# Patient Record
Sex: Female | Born: 1995 | Race: White | Hispanic: No | Marital: Single | Smoking: Never smoker
Health system: Southern US, Community
[De-identification: ages and names within clinical notes are randomized; demographics above are authoritative.]

## PROBLEM LIST (undated history)

## (undated) DIAGNOSIS — F419 Anxiety disorder, unspecified: Secondary | ICD-10-CM

## (undated) DIAGNOSIS — F32A Depression, unspecified: Secondary | ICD-10-CM

## (undated) HISTORY — DX: Anxiety disorder, unspecified: F41.9

## (undated) HISTORY — PX: WISDOM TOOTH EXTRACTION: SHX21

## (undated) HISTORY — PX: BREAST SURGERY: SHX581

## (undated) HISTORY — DX: Depression, unspecified: F32.A

---
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
## 2019-04-01 ENCOUNTER — Ambulatory Visit: Payer: Self-pay | Attending: Internal Medicine

## 2021-05-31 NOTE — Progress Notes (Signed)
? ?New Patient Office Visit ? ?Subjective:  ?Patient ID: Felicia Stevens, female    DOB: 22-Oct-1995  Age: 26 y.o. MRN: [id number] ? ?CC:  ?Chief Complaint  ?Patient presents with  ? Establish Care  ?  Np. Est care. Med refill   ? ? ?HPI ?Felicia Stevens presents for new patient visit to establish care.  Introduced to Publishing rights manager role and practice setting.  All questions answered.  Discussed provider/patient relationship and expectations. ? ?She has a history of depression and anxiety. She tried wellbutrin which didn't help and caused her to feel more depressed. Then she was started on lexapro which helped for a while. Then she lost her health insurance, so she stopped the medication. She then re-started the medication and felt it was helping as much as in the past and was having more anxiety. Then she was switched to zoloft 100mg  daily. This has significantly helped her anxiety and it helps with her depression. She states that she still has trouble with motivation and feels overwhelmed with chores that need to be done around the house. She also endorses some mood swings, irritability, and fidgeting. She has not been to psychiatry however she has talked to a therapist in the past.  ? ?Depression screen [facility] 2/9 06/01/2021  ?Decreased Interest 1  ?Down, Depressed, Hopeless 0  ?PHQ - 2 Score 1  ?Altered sleeping 0  ?Tired, decreased energy 1  ?Change in appetite 0  ?Feeling bad or failure about yourself  0  ?Trouble concentrating 3  ?Moving slowly or fidgety/restless 0  ?Suicidal thoughts 0  ?PHQ-9 Score 5  ?Difficult doing work/chores Somewhat difficult  ? ?GAD 7 : Generalized Anxiety Score 06/01/2021  ?Nervous, Anxious, on Edge 0  ?Control/stop worrying 0  ?Worry too much - different things 0  ?Trouble relaxing 3  ?Restless 0  ?Easily annoyed or irritable 1  ?Afraid - awful might happen 0  ?Total GAD 7 Score 4  ?Anxiety Difficulty Not difficult at all  ? ? ?Past Medical History:  ?Diagnosis Date  ? Anxiety   ?  Depression   ? ? ?Past Surgical History:  ?Procedure Laterality Date  ? BREAST SURGERY    ? implant  ? WISDOM TOOTH EXTRACTION Bilateral   ? ? ?Family History  ?Problem Relation Age of Onset  ? Anxiety disorder Mother   ? Hypertension Mother   ? Cancer Father   ?     unsure "not genetic"  ? ADD / ADHD Daughter   ? Bipolar disorder Maternal Grandmother   ? ? ?Social History  ? ?Socioeconomic History  ? Marital status: Single  ?  Spouse name: Not on file  ? Number of children: Not on file  ? Years of education: Not on file  ? Highest education level: Not on file  ?Occupational History  ? Not on file  ?Tobacco Use  ? Smoking status: Never  ? Smokeless tobacco: Never  ?Vaping Use  ? Vaping Use: Never used  ?Substance and Sexual Activity  ? Alcohol use: Yes  ?  Comment: occasionally  ? Drug use: Never  ? Sexual activity: Not Currently  ?  Birth control/protection: None  ?Other Topics Concern  ? Not on file  ?Social History Narrative  ? Not on file  ? ?Social Determinants of Health  ? ?Financial Resource Strain: Not on file  ?Food Insecurity: Not on file  ?Transportation Needs: Not on file  ?Physical Activity: Not on file  ?Stress: Not on file  ?  Social Connections: Not on file  ?Intimate Partner Violence: Not on file  ? ? ?ROS ?Review of Systems  ?Constitutional: Negative.   ?HENT:  Positive for hearing loss (unsure if it's from trouble focusing or paying attention).   ?Eyes: Negative.   ?Respiratory: Negative.    ?Cardiovascular: Negative.   ?Gastrointestinal: Negative.   ?Endocrine: Negative.   ?Genitourinary: Negative.   ?Musculoskeletal: Negative.   ?Skin: Negative.   ?Neurological: Negative.   ?Psychiatric/Behavioral:  The patient is nervous/anxious.   ? ?Objective:  ? ?Today's Vitals: BP 129/77   Pulse 77   Temp (!) 97.4 ?F (36.3 ?C) (Temporal)   Ht 5\' 6"  (1.676 m)   Wt 164 lb 6.4 oz (74.6 kg)   LMP 05/25/2021 (Exact Date)   SpO2 99%   BMI 26.53 kg/m?  ? ?Physical Exam ?Vitals and nursing note reviewed.   ?Constitutional:   ?   General: She is not in acute distress. ?   Appearance: Normal appearance.  ?HENT:  ?   Head: Normocephalic and atraumatic.  ?   Comments: Normal whisper test bilaterally ?   Right Ear: Tympanic membrane, ear canal and external ear normal.  ?   Left Ear: Tympanic membrane, ear canal and external ear normal.  ?   Nose: Nose normal.  ?   Mouth/Throat:  ?   Mouth: Mucous membranes are moist.  ?   Pharynx: Oropharynx is clear.  ?Eyes:  ?   Conjunctiva/sclera: Conjunctivae normal.  ?Cardiovascular:  ?   Rate and Rhythm: Normal rate and regular rhythm.  ?   Pulses: Normal pulses.  ?   Heart sounds: Normal heart sounds.  ?Pulmonary:  ?   Effort: Pulmonary effort is normal.  ?   Breath sounds: Normal breath sounds.  ?Abdominal:  ?   General: Bowel sounds are normal.  ?   Palpations: Abdomen is soft.  ?   Tenderness: There is no abdominal tenderness.  ?Musculoskeletal:     ?   General: Normal range of motion.  ?   Cervical back: Normal range of motion.  ?Skin: ?   General: Skin is warm and dry.  ?Neurological:  ?   General: No focal deficit present.  ?   Mental Status: She is alert and oriented to person, place, and time.  ?Psychiatric:     ?   Mood and Affect: Mood normal.     ?   Behavior: Behavior normal.     ?   Thought Content: Thought content normal.     ?   Judgment: Judgment normal.  ? ? ?Assessment & Plan:  ? ?Problem List Items Addressed This Visit   ? ?  ? Other  ? Depression, recurrent (HCC) - Primary  ?  Chronic, ongoing. She has endorsed some symptoms of decreased motivation, fidgeting, and mood swings. Her daughter was diagnosed with ADHD and she is wondering if she may have this as well. She also does have a family history of bipolar. Will place referral to psychiatry for testing. PHQ-9 score is a 5 and GAD 7 is a 4 today. Will increase sertraline to 150mg  daily. Follow up in 4-6 weeks.  ?  ?  ? Relevant Medications  ? Sertraline HCl 150 MG CAPS  ? Other Relevant Orders  ? Ambulatory  referral to Psychiatry  ? Anxiety  ?  Chronic, ongoing. She has endorsed some symptoms of decreased motivation, fidgeting, and mood swings. Her daughter was diagnosed with ADHD and she is wondering if she may  have this as well. She also does have a family history of bipolar. Will place referral to psychiatry for testing. PHQ-9 score is a 5 and GAD 7 is a 4 today. Will increase sertraline to 150mg  daily. Follow up in 4-6 weeks.  ?  ?  ? Relevant Medications  ? Sertraline HCl 150 MG CAPS  ? Other Relevant Orders  ? Ambulatory referral to Psychiatry  ? ?Other Visit Diagnoses   ? ? Need for Td vaccine      ? Td booster updated today  ? Relevant Orders  ? Td vaccine greater than or equal to 7yo preservative free IM (Completed)  ? ?  ? ? ?Outpatient Encounter Medications as of 06/01/2021  ?Medication Sig  ? Multiple Vitamin (MULTIVITAMIN ADULT PO) Take by mouth.  ? Sertraline HCl 150 MG CAPS Take 150 mg by mouth daily.  ? [DISCONTINUED] sertraline (ZOLOFT) 100 MG tablet Take 100 mg by mouth daily.  ? ?No facility-administered encounter medications on file as of 06/01/2021.  ? ? ?Follow-up: Return in about 4 weeks (around 06/29/2021) for 4-6 weeks, CPE.  ? ?07/01/2021, NP ? ?

## 2021-06-01 ENCOUNTER — Ambulatory Visit (INDEPENDENT_AMBULATORY_CARE_PROVIDER_SITE_OTHER): Payer: 59 | Admitting: Nurse Practitioner

## 2021-06-01 ENCOUNTER — Other Ambulatory Visit: Payer: Self-pay

## 2021-06-01 ENCOUNTER — Encounter: Payer: Self-pay | Admitting: Nurse Practitioner

## 2021-06-01 VITALS — BP 129/77 | HR 77 | Temp 97.4°F | Ht 66.0 in | Wt 164.4 lb

## 2021-06-01 DIAGNOSIS — Z23 Encounter for immunization: Secondary | ICD-10-CM

## 2021-06-01 DIAGNOSIS — F419 Anxiety disorder, unspecified: Secondary | ICD-10-CM | POA: Insufficient documentation

## 2021-06-01 DIAGNOSIS — F339 Major depressive disorder, recurrent, unspecified: Secondary | ICD-10-CM | POA: Insufficient documentation

## 2021-06-01 MED ORDER — SERTRALINE HCL 150 MG PO CAPS: 150.0000 mg | ORAL_CAPSULE | Freq: Every day | ORAL | 0 refills | Status: DC

## 2021-06-01 NOTE — Patient Instructions (Signed)
It was great to see you! ? ?Let's increase your sertraline to 150mg  daily. I am also going to place the referral to psychiatry as we discussed.  ? ?Let's follow-up in 4-6 weeks, sooner if you have concerns. ? ?If a referral was placed today, you will be contacted for an appointment. Please note that routine referrals can sometimes take up to 3-4 weeks to process. Please call our office if you haven't heard anything after this time frame. ? ?Take care, ? ? , NP ? ?

## 2021-06-01 NOTE — Assessment & Plan Note (Signed)
Chronic, ongoing. She has endorsed some symptoms of decreased motivation, fidgeting, and mood swings. Her daughter was diagnosed with ADHD and she is wondering if she may have this as well. She also does have a family history of bipolar. Will place referral to psychiatry for testing. PHQ-9 score is a 5 and GAD 7 is a 4 today. Will increase sertraline to 150mg daily. Follow up in 4-6 weeks.  ?

## 2021-06-01 NOTE — Assessment & Plan Note (Signed)
Chronic, ongoing. She has endorsed some symptoms of decreased motivation, fidgeting, and mood swings. Her daughter was diagnosed with ADHD and she is wondering if she may have this as well. She also does have a family history of bipolar. Will place referral to psychiatry for testing. PHQ-9 score is a 5 and GAD 7 is a 4 today. Will increase sertraline to 150mg  daily. Follow up in 4-6 weeks.  ?

## 2021-06-07 ENCOUNTER — Encounter: Payer: Self-pay | Admitting: Nurse Practitioner

## 2021-06-08 MED ORDER — SERTRALINE HCL 100 MG PO TABS: 150.0000 mg | ORAL_TABLET | Freq: Every day | ORAL | 0 refills | Status: DC

## 2021-06-27 NOTE — Progress Notes (Deleted)
? ?There were no vitals taken for this visit.  ? ?Subjective:  ? ? Patient ID: Felicia Stevens, female    DOB: [birth year], 26 y.o.   MRN: [id number] ? ?HPI: ?Felicia Stevens is a 26 y.o. female presenting on 06/29/2021 for comprehensive medical examination. Current medical complaints include:{Blank single:19197::"none","***"} ? ?She currently lives with: ?Menopausal Symptoms: {Blank single:19197::"yes","no"} ? ?Depression Screen done today and results listed below:  ? ?  06/01/2021  ? 10:52 AM  ?Depression screen PHQ 2/9  ?Decreased Interest 1  ?Down, Depressed, Hopeless 0  ?PHQ - 2 Score 1  ?Altered sleeping 0  ?Tired, decreased energy 1  ?Change in appetite 0  ?Feeling bad or failure about yourself  0  ?Trouble concentrating 3  ?Moving slowly or fidgety/restless 0  ?Suicidal thoughts 0  ?PHQ-9 Score 5  ?Difficult doing work/chores Somewhat difficult  ? ? ?The patient {has/does not have:19849} a history of falls. I {did/did not:19850} complete a risk assessment for falls. A plan of care for falls {was/was not:19852} documented. ? ? ?Past Medical History:  ?Past Medical History:  ?Diagnosis Date  ? Anxiety   ? Depression   ? ? ?Surgical History:  ?Past Surgical History:  ?Procedure Laterality Date  ? BREAST SURGERY    ? implant  ? WISDOM TOOTH EXTRACTION Bilateral   ? ? ?Medications:  ?Current Outpatient Medications on File Prior to Visit  ?Medication Sig  ? Multiple Vitamin (MULTIVITAMIN ADULT PO) Take by mouth.  ? sertraline (ZOLOFT) 100 MG tablet Take 1.5 tablets (150 mg total) by mouth daily.  ? ?No current facility-administered medications on file prior to visit.  ? ? ?Allergies:  ?Allergies  ?Allergen Reactions  ? Cephalexin Itching  ?  Other reaction(s): ITCHING ?Other reaction(s): ITCHING ?  ? ? ?Social History:  ?Social History  ? ?Socioeconomic History  ? Marital status: Single  ?  Spouse name: Not on file  ? Number of children: Not on file  ? Years of education: Not on file  ? Highest education level: Not on file   ?Occupational History  ? Not on file  ?Tobacco Use  ? Smoking status: Never  ? Smokeless tobacco: Never  ?Vaping Use  ? Vaping Use: Never used  ?Substance and Sexual Activity  ? Alcohol use: Yes  ?  Comment: occasionally  ? Drug use: Never  ? Sexual activity: Not Currently  ?  Birth control/protection: None  ?Other Topics Concern  ? Not on file  ?Social History Narrative  ? Not on file  ? ?Social Determinants of Health  ? ?Financial Resource Strain: Not on file  ?Food Insecurity: Not on file  ?Transportation Needs: Not on file  ?Physical Activity: Not on file  ?Stress: Not on file  ?Social Connections: Not on file  ?Intimate Partner Violence: Not on file  ? ?Social History  ? ?Tobacco Use  ?Smoking Status Never  ?Smokeless Tobacco Never  ? ?Social History  ? ?Substance and Sexual Activity  ?Alcohol Use Yes  ? Comment: occasionally  ? ? ?Family History:  ?Family History  ?Problem Relation Age of Onset  ? Anxiety disorder Mother   ? Hypertension Mother   ? Cancer Father   ?     unsure "not genetic"  ? ADD / ADHD Daughter   ? Bipolar disorder Maternal Grandmother   ? ? ?Past medical history, surgical history, medications, allergies, family history and social history reviewed with patient today and changes made to appropriate areas of the chart.  ? ?  ROS ?All other ROS negative except what is listed above and in the HPI.  ? ?   ?Objective:  ?  ?There were no vitals taken for this visit.  ?Wt Readings from Last 3 Encounters:  ?06/01/21 164 lb 6.4 oz (74.6 kg)  ?  ?Physical Exam ? ?Results for orders placed or performed during the hospital encounter of 05/02/09  ?CBC  ?Result Value Ref Range  ? WBC 12.8 4.5 - 13.5 K/uL  ? RBC 4.19 3.80 - 5.20 MIL/uL  ? Hemoglobin 13.3 11.0 - 14.6 g/dL  ? HCT 38.1 33.0 - 44.0 %  ? MCV 90.9 77.0 - 95.0 fL  ? MCHC 34.9 31.0 - 37.0 g/dL  ? RDW 12.2 11.3 - 15.5 %  ? Platelets 197 150 - 400 K/uL  ?RPR  ?Result Value Ref Range  ? RPR Ser Ql NON REACTIVE NON REACTIVE  ?CBC  ?Result Value Ref  Range  ? WBC 12.7 4.5 - 13.5 K/uL  ? RBC 3.66 (L) 3.80 - 5.20 MIL/uL  ? Hemoglobin 11.4 11.0 - 14.6 g/dL  ? HCT 33.4 33.0 - 44.0 %  ? MCV 91.3 77.0 - 95.0 fL  ? MCHC 34.1 31.0 - 37.0 g/dL  ? RDW 13.1 11.3 - 15.5 %  ? Platelets 153 150 - 400 K/uL  ? ?   ?Assessment & Plan:  ? ?Problem List Items Addressed This Visit   ?None ?  ? ?Follow up plan: ?No follow-ups on file. ? ? ?LABORATORY TESTING:  ?- Pap smear: {Blank single:19197::"pap done","not applicable","up to date","done elsewhere"} ? ?IMMUNIZATIONS:   ?- Tdap: Tetanus vaccination status reviewed: last tetanus booster within 10 years. ?- Influenza: Refused ?- Pneumovax: Not applicable ?- Prevnar: Not applicable ?- HPV: {Blank single:19197::"Up to date","Administered today","Not applicable","Refused","Given elsewhere"} ?- Zostavax vaccine: Not applicable ? ?SCREENING: ?-Mammogram: Not applicable  ?- Colonoscopy: Not applicable  ?- Bone Density: Not applicable  ?-Hearing Test: Not applicable  ?-Spirometry: Not applicable  ? ?PATIENT COUNSELING:   ?Advised to take 1 mg of folate supplement per day if capable of pregnancy.  ? ?Sexuality: Discussed sexually transmitted diseases, partner selection, use of condoms, avoidance of unintended pregnancy  and contraceptive alternatives.  ? ?Advised to avoid cigarette smoking. ? ?I discussed with the patient that most people either abstain from alcohol or drink within safe limits (<=14/week and <=4 drinks/occasion for males, <=7/weeks and <= 3 drinks/occasion for females) and that the risk for alcohol disorders and other health effects rises proportionally with the number of drinks per week and how often a drinker exceeds daily limits. ? ?Discussed cessation/primary prevention of drug use and availability of treatment for abuse.  ? ?Diet: Encouraged to adjust caloric intake to maintain  or achieve ideal body weight, to reduce intake of dietary saturated fat and total fat, to limit sodium intake by avoiding high sodium foods  and not adding table salt, and to maintain adequate dietary potassium and calcium preferably from fresh fruits, vegetables, and low-fat dairy products.   ? ?stressed the importance of regular exercise ? ?Injury prevention: Discussed safety belts, safety helmets, smoke detector, smoking near bedding or upholstery.  ? ?Dental health: Discussed importance of regular tooth brushing, flossing, and dental visits.  ? ? ?NEXT PREVENTATIVE PHYSICAL DUE IN 1 YEAR. ?No follow-ups on file. ?

## 2021-06-29 ENCOUNTER — Telehealth: Payer: Self-pay | Admitting: Nurse Practitioner

## 2021-06-29 ENCOUNTER — Encounter: Payer: 59 | Admitting: Nurse Practitioner

## 2021-06-29 NOTE — Telephone Encounter (Signed)
Pt did not show up for her 06/29/21 with Lauren for a cpe, I sent a no show letter. ?

## 2021-07-01 ENCOUNTER — Ambulatory Visit (INDEPENDENT_AMBULATORY_CARE_PROVIDER_SITE_OTHER): Payer: 59 | Admitting: Nurse Practitioner

## 2021-07-01 ENCOUNTER — Encounter: Payer: Self-pay | Admitting: Nurse Practitioner

## 2021-07-01 ENCOUNTER — Other Ambulatory Visit (HOSPITAL_COMMUNITY)
Admission: RE | Admit: 2021-07-01 | Discharge: 2021-07-01 | Disposition: A | Discharge status: Home / Self Care | Payer: 59 | Source: Ambulatory Visit | Attending: Nurse Practitioner | Admitting: Nurse Practitioner

## 2021-07-01 VITALS — BP 116/76 | HR 71 | Temp 97.7°F | Ht 66.0 in | Wt 166.4 lb

## 2021-07-01 DIAGNOSIS — Z8639 Personal history of other endocrine, nutritional and metabolic disease: Secondary | ICD-10-CM

## 2021-07-01 DIAGNOSIS — Z0000 Encounter for general adult medical examination without abnormal findings: Secondary | ICD-10-CM

## 2021-07-01 DIAGNOSIS — Z124 Encounter for screening for malignant neoplasm of cervix: Secondary | ICD-10-CM | POA: Diagnosis present

## 2021-07-01 DIAGNOSIS — Z13220 Encounter for screening for lipoid disorders: Secondary | ICD-10-CM

## 2021-07-01 DIAGNOSIS — F419 Anxiety disorder, unspecified: Secondary | ICD-10-CM

## 2021-07-01 DIAGNOSIS — F339 Major depressive disorder, recurrent, unspecified: Secondary | ICD-10-CM

## 2021-07-01 DIAGNOSIS — Z136 Encounter for screening for cardiovascular disorders: Secondary | ICD-10-CM

## 2021-07-01 NOTE — Patient Instructions (Signed)
It was great to see you! ? ?We are checking labs today and will let you know the results.  ? ?Let's follow-up in 6 months, sooner if you have concerns. ? ?If a referral was placed today, you will be contacted for an appointment. Please note that routine referrals can sometimes take up to 3-4 weeks to process. Please call our office if you haven't heard anything after this time frame. ? ?Take care, ? ?Vance Peper, NP ? ?

## 2021-07-01 NOTE — Assessment & Plan Note (Signed)
Chronic, ongoing.  She states that her mood has improved slightly after increasing her activity and starting a vitamin B12 supplement daily.  She is also increased her sertraline to 150 mg daily.  We will continue this regimen.  Follow-up in 6 months. ?

## 2021-07-01 NOTE — Progress Notes (Signed)
? ?BP 116/76 (BP Location: Left Arm, Patient Position: Sitting, Cuff Size: Normal)   Pulse 71   Temp 97.7 ?F (36.5 ?C) (Temporal)   Ht 5\' 6"  (1.676 m)   Wt 166 lb 6.4 oz (75.5 kg)   SpO2 98%   BMI 26.86 kg/m?   ? ?Subjective:  ? ? Patient ID: , female    DOB: [birth year], 26 y.o.   MRN: [id number] ? ?HPI: ?Felicia Stevens is a 26 y.o. female presenting on 07/01/2021 for comprehensive medical examination. Current medical complaints include: depression and anxiety ? ?Last visit her sertraline was increased to 150mg  daily. She has been eating better, going on walks and exercising which has helped her symptoms.  She also started a vitamin B12 supplement over-the-counter which has helped with her fatigue. ? ?She currently lives with: daughter ?Menopausal Symptoms: no ? ?Depression and anxiety screen done today and results listed below:  ? ?  07/01/2021  ?  2:34 PM 06/01/2021  ? 10:52 AM  ?Depression screen PHQ 2/9  ?Decreased Interest 0 1  ?Down, Depressed, Hopeless 0 0  ?PHQ - 2 Score 0 1  ?Altered sleeping 1 0  ?Tired, decreased energy 1 1  ?Change in appetite 0 0  ?Feeling bad or failure about yourself  0 0  ?Trouble concentrating 0 3  ?Moving slowly or fidgety/restless 0 0  ?Suicidal thoughts 0 0  ?PHQ-9 Score 2 5  ?Difficult doing work/chores Somewhat difficult Somewhat difficult  ? ? ?  07/01/2021  ?  2:34 PM 06/01/2021  ? 10:53 AM  ?GAD 7 : Generalized Anxiety Score  ?Nervous, Anxious, on Edge 0 0  ?Control/stop worrying 0 0  ?Worry too much - different things 1 0  ?Trouble relaxing 0 3  ?Restless 0 0  ?Easily annoyed or irritable 0 1  ?Afraid - awful might happen 0 0  ?Total GAD 7 Score 1 4  ?Anxiety Difficulty Somewhat difficult Not difficult at all  ? ? ?The patient does not have a history of falls. I did not complete a risk assessment for falls. A plan of care for falls was not documented. ? ? ?Past Medical History:  ?Past Medical History:  ?Diagnosis Date  ? Anxiety   ? Depression   ? ? ?Surgical  History:  ?Past Surgical History:  ?Procedure Laterality Date  ? BREAST SURGERY    ? implant  ? WISDOM TOOTH EXTRACTION Bilateral   ? ? ?Medications:  ?Current Outpatient Medications on File Prior to Visit  ?Medication Sig  ? Multiple Vitamin (MULTIVITAMIN ADULT PO) Take by mouth.  ? sertraline (ZOLOFT) 100 MG tablet Take 1.5 tablets (150 mg total) by mouth daily.  ? ?No current facility-administered medications on file prior to visit.  ? ? ?Allergies:  ?Allergies  ?Allergen Reactions  ? Cephalexin Itching  ?  Other reaction(s): ITCHING ?Other reaction(s): ITCHING ?  ? ? ?Social History:  ?Social History  ? ?Socioeconomic History  ? Marital status: Single  ?  Spouse name: Not on file  ? Number of children: Not on file  ? Years of education: Not on file  ? Highest education level: Not on file  ?Occupational History  ? Not on file  ?Tobacco Use  ? Smoking status: Never  ? Smokeless tobacco: Never  ?Vaping Use  ? Vaping Use: Never used  ?Substance and Sexual Activity  ? Alcohol use: Yes  ?  Comment: occasionally  ? Drug use: Never  ? Sexual activity: Not Currently  ?  Birth control/protection: None  ?Other Topics Concern  ? Not on file  ?Social History Narrative  ? Not on file  ? ?Social Determinants of Health  ? ?Financial Resource Strain: Not on file  ?Food Insecurity: Not on file  ?Transportation Needs: Not on file  ?Physical Activity: Not on file  ?Stress: Not on file  ?Social Connections: Not on file  ?Intimate Partner Violence: Not on file  ? ?Social History  ? ?Tobacco Use  ?Smoking Status Never  ?Smokeless Tobacco Never  ? ?Social History  ? ?Substance and Sexual Activity  ?Alcohol Use Yes  ? Comment: occasionally  ? ? ?Family History:  ?Family History  ?Problem Relation Age of Onset  ? Anxiety disorder Mother   ? Hypertension Mother   ? Cancer Father   ?     unsure "not genetic"  ? ADD / ADHD Daughter   ? Bipolar disorder Maternal Grandmother   ? ? ?Past medical history, surgical history, medications,  allergies, family history and social history reviewed with patient today and changes made to appropriate areas of the chart.  ? ?Review of Systems  ?Constitutional: Negative.   ?HENT: Negative.    ?Eyes: Negative.   ?Respiratory: Negative.    ?Cardiovascular: Negative.   ?Gastrointestinal: Negative.   ?Genitourinary: Negative.   ?Musculoskeletal: Negative.   ?Skin: Negative.   ?Neurological: Negative.   ?Psychiatric/Behavioral:  Positive for depression.   ?All other ROS negative except what is listed above and in the HPI.  ? ?   ?Objective:  ?  ?BP 116/76 (BP Location: Left Arm, Patient Position: Sitting, Cuff Size: Normal)   Pulse 71   Temp 97.7 ?F (36.5 ?C) (Temporal)   Ht 5\' 6"  (1.676 m)   Wt 166 lb 6.4 oz (75.5 kg)   SpO2 98%   BMI 26.86 kg/m?   ?Wt Readings from Last 3 Encounters:  ?07/01/21 166 lb 6.4 oz (75.5 kg)  ?06/01/21 164 lb 6.4 oz (74.6 kg)  ?  ?Physical Exam ?Vitals and nursing note reviewed. Exam conducted with a chaperone present.  ?Constitutional:   ?   General: She is not in acute distress. ?   Appearance: Normal appearance.  ?HENT:  ?   Head: Normocephalic and atraumatic.  ?   Right Ear: Tympanic membrane, ear canal and external ear normal.  ?   Left Ear: Tympanic membrane, ear canal and external ear normal.  ?Eyes:  ?   Conjunctiva/sclera: Conjunctivae normal.  ?Cardiovascular:  ?   Rate and Rhythm: Normal rate and regular rhythm.  ?   Pulses: Normal pulses.  ?   Heart sounds: Normal heart sounds.  ?Pulmonary:  ?   Effort: Pulmonary effort is normal.  ?   Breath sounds: Normal breath sounds.  ?Abdominal:  ?   General: Bowel sounds are normal.  ?   Palpations: Abdomen is soft.  ?   Tenderness: There is no abdominal tenderness.  ?Genitourinary: ?   Exam position: Lithotomy position.  ?   Labia:     ?   Right: No rash, tenderness or lesion.     ?   Left: No rash, tenderness or lesion.   ?   Vagina: Normal.  ?   Cervix: Normal.  ?   Uterus: Normal.   ?   Adnexa: Right adnexa normal and left  adnexa normal.  ?Musculoskeletal:     ?   General: Normal range of motion.  ?   Cervical back: Normal range of motion.  ?  Right lower leg: No edema.  ?   Left lower leg: No edema.  ?Skin: ?   General: Skin is warm and dry.  ?Neurological:  ?   General: No focal deficit present.  ?   Mental Status: She is alert and oriented to person, place, and time.  ?   Cranial Nerves: No cranial nerve deficit.  ?   Coordination: Coordination normal.  ?   Gait: Gait normal.  ?Psychiatric:     ?   Mood and Affect: Mood normal.     ?   Behavior: Behavior normal.     ?   Thought Content: Thought content normal.     ?   Judgment: Judgment normal.  ? ? ?Results for orders placed or performed during the hospital encounter of 05/02/09  ?CBC  ?Result Value Ref Range  ? WBC 12.8 4.5 - 13.5 K/uL  ? RBC 4.19 3.80 - 5.20 MIL/uL  ? Hemoglobin 13.3 11.0 - 14.6 g/dL  ? HCT 38.1 33.0 - 44.0 %  ? MCV 90.9 77.0 - 95.0 fL  ? MCHC 34.9 31.0 - 37.0 g/dL  ? RDW 12.2 11.3 - 15.5 %  ? Platelets 197 150 - 400 K/uL  ?RPR  ?Result Value Ref Range  ? RPR Ser Ql NON REACTIVE NON REACTIVE  ?CBC  ?Result Value Ref Range  ? WBC 12.7 4.5 - 13.5 K/uL  ? RBC 3.66 (L) 3.80 - 5.20 MIL/uL  ? Hemoglobin 11.4 11.0 - 14.6 g/dL  ? HCT 33.4 33.0 - 44.0 %  ? MCV 91.3 77.0 - 95.0 fL  ? MCHC 34.1 31.0 - 37.0 g/dL  ? RDW 13.1 11.3 - 15.5 %  ? Platelets 153 150 - 400 K/uL  ? ?   ?Assessment & Plan:  ? ?Problem List Items Addressed This Visit   ? ?  ? Other  ? Depression, recurrent (HCC)  ?  Chronic, ongoing.  She states that her mood has improved slightly after increasing her activity and starting a vitamin B12 supplement daily.  She is also increased her sertraline to 150 mg daily.  We will continue this regimen.  Follow-up in 6 months. ?  ?  ? Anxiety  ?  Chronic, ongoing.  She states that her mood has improved slightly after increasing her activity and starting a vitamin B12 supplement daily.  She is also increased her sertraline to 150 mg daily.  We will continue this  regimen.  Follow-up in 6 months. ?  ?  ? ?Other Visit Diagnoses   ? ? Routine general medical examination at a health care facility    -  Primary  ? Health maintenance reviewed and updated.  Pap done today.  Check CMP, CBC.  Foll

## 2021-07-01 NOTE — Assessment & Plan Note (Signed)
Chronic, ongoing.  She states that her mood has improved slightly after increasing her activity and starting a vitamin B12 supplement daily.  She is also increased her sertraline to 150 mg daily.  We will continue this regimen.  Follow-up in 6 months. ?

## 2021-07-02 LAB — CBC WITH DIFFERENTIAL/PLATELET
Absolute Monocytes: 663 cells/uL (ref 200–950)
Basophils Absolute: 62 cells/uL (ref 0–200)
Basophils Relative: 1 %
Eosinophils Absolute: 93 cells/uL (ref 15–500)
Eosinophils Relative: 1.5 %
HCT: 39.5 % (ref 35.0–45.0)
Hemoglobin: 13.2 g/dL (ref 11.7–15.5)
Lymphs Abs: 1934 cells/uL (ref 850–3900)
MCH: 29.7 pg (ref 27.0–33.0)
MCHC: 33.4 g/dL (ref 32.0–36.0)
MCV: 89 fL (ref 80.0–100.0)
MPV: 10.4 fL (ref 7.5–12.5)
Monocytes Relative: 10.7 %
Neutro Abs: 3447 cells/uL (ref 1500–7800)
Neutrophils Relative %: 55.6 %
Platelets: 314 10*3/uL (ref 140–400)
RBC: 4.44 10*6/uL (ref 3.80–5.10)
RDW: 12.4 % (ref 11.0–15.0)
Total Lymphocyte: 31.2 %
WBC: 6.2 10*3/uL (ref 3.8–10.8)

## 2021-07-02 LAB — COMPREHENSIVE METABOLIC PANEL
AG Ratio: 2.1 (calc) (ref 1.0–2.5)
ALT: 28 U/L (ref 6–29)
AST: 18 U/L (ref 10–30)
Albumin: 4.1 g/dL (ref 3.6–5.1)
Alkaline phosphatase (APISO): 38 U/L (ref 31–125)
BUN: 15 mg/dL (ref 7–25)
CO2: 25 mmol/L (ref 20–32)
Calcium: 9.5 mg/dL (ref 8.6–10.2)
Chloride: 105 mmol/L (ref 98–110)
Creat: 0.72 mg/dL (ref 0.50–0.96)
Globulin: 2 g/dL (calc) (ref 1.9–3.7)
Glucose, Bld: 93 mg/dL (ref 65–99)
Potassium: 4.2 mmol/L (ref 3.5–5.3)
Sodium: 140 mmol/L (ref 135–146)
Total Bilirubin: 0.3 mg/dL (ref 0.2–1.2)
Total Protein: 6.1 g/dL (ref 6.1–8.1)

## 2021-07-02 LAB — LIPID PANEL
Cholesterol: 259 mg/dL — ABNORMAL HIGH (ref <200)
HDL: 61 mg/dL (ref >=50)
LDL Cholesterol (Calc): 184 mg/dL (calc) — ABNORMAL HIGH
Non-HDL Cholesterol (Calc): 198 mg/dL (calc) — ABNORMAL HIGH (ref <130)
Total CHOL/HDL Ratio: 4.2 (calc) (ref <5.0)
Triglycerides: 50 mg/dL (ref <150)

## 2021-07-02 LAB — VITAMIN D 25 HYDROXY (VIT D DEFICIENCY, FRACTURES): Vit D, 25-Hydroxy: 20 ng/mL — ABNORMAL LOW (ref 30–100)

## 2021-07-02 LAB — VITAMIN B12: Vitamin B-12: 412 pg/mL (ref 200–1100)

## 2021-07-04 LAB — CYTOLOGY - PAP
Adequacy: ABSENT
Diagnosis: NEGATIVE

## 2021-07-12 NOTE — Telephone Encounter (Signed)
1st no show, fee waived ?

## 2021-08-30 ENCOUNTER — Telehealth (HOSPITAL_COMMUNITY): Payer: Self-pay | Admitting: Psychiatry

## 2021-09-21 ENCOUNTER — Other Ambulatory Visit: Payer: Self-pay

## 2021-09-21 MED ORDER — SERTRALINE HCL 100 MG PO TABS: 100.0000 mg | ORAL_TABLET | Freq: Every day | ORAL | 1 refills | Status: AC

## 2022-12-08 ENCOUNTER — Ambulatory Visit
Admission: EM | Admit: 2022-12-08 | Discharge: 2022-12-08 | Disposition: A | Discharge status: Home / Self Care | Payer: Medicaid Other | Attending: Internal Medicine | Admitting: Internal Medicine

## 2022-12-08 DIAGNOSIS — G43809 Other migraine, not intractable, without status migrainosus: Secondary | ICD-10-CM | POA: Diagnosis not present

## 2022-12-08 MED ORDER — DEXAMETHASONE SODIUM PHOSPHATE 10 MG/ML IJ SOLN
10.0000 mg | Freq: Once | INTRAMUSCULAR | Status: AC
  Administered 2022-12-08: 10 mg via INTRAMUSCULAR

## 2022-12-08 MED ORDER — KETOROLAC TROMETHAMINE 30 MG/ML IJ SOLN
30.0000 mg | Freq: Once | INTRAMUSCULAR | Status: AC
  Administered 2022-12-08: 30 mg via INTRAMUSCULAR

## 2022-12-08 MED ORDER — ONDANSETRON 4 MG PO TBDP
4.0000 mg | ORAL_TABLET | Freq: Once | ORAL | Status: AC
  Administered 2022-12-08: 4 mg via ORAL

## 2022-12-08 NOTE — ED Provider Notes (Signed)
[facility]    CSN: [id number] Arrival date & time: 12/08/22  0815      History   Chief Complaint Chief Complaint  Patient presents with   Migraine    HPI Felicia Stevens is a 27 y.o. female.   Patient presents with migraine headache that started about 5 days ago.  Reports it is present in the occipital portion of her head and seems to radiate upwards.  Reports that she has had some dizziness and nausea without vomiting as well.  Denies any recent falls or head injury.  States that she has had a migraine previously that only lasted about 12 hours but does not have any significant history of migraines.  She has taken Excedrin with last dose being yesterday with no improvement.   Migraine    Past Medical History:  Diagnosis Date   Anxiety    Depression     Patient Active Problem List   Diagnosis Date Noted   Depression, recurrent (HCC) 06/01/2021   Anxiety 06/01/2021    Past Surgical History:  Procedure Laterality Date   BREAST SURGERY     implant   WISDOM TOOTH EXTRACTION Bilateral     OB History   No obstetric history on file.      Home Medications    Prior to Admission medications   Medication Sig Start Date End Date Taking? Authorizing Provider  sertraline (ZOLOFT) 100 MG tablet Take 1 tablet (100 mg total) by mouth daily. 09/21/21  Yes McElwee, Lauren A, NP  Multiple Vitamin (MULTIVITAMIN ADULT PO) Take by mouth.    [provider]    Family History Family History  Problem Relation Age of Onset   Anxiety disorder Mother    Hypertension Mother    Cancer Father        unsure "not genetic"   ADD / ADHD Daughter    Bipolar disorder Maternal Grandmother     Social History Social History   Tobacco Use   Smoking status: Never   Smokeless tobacco: Never  Vaping Use   Vaping status: Never Used  Substance Use Topics   Alcohol use: Yes    Comment: occasionally   Drug use: Never     Allergies   Cephalexin   Review of  Systems Review of Systems Per HPI  Physical Exam Triage Vital Signs ED Triage Vitals  Encounter Vitals Group     BP 12/08/22 0844 125/88     Systolic BP Percentile --      Diastolic BP Percentile --      Pulse Rate 12/08/22 0844 94     Resp 12/08/22 0844 17     Temp 12/08/22 0844 98.2 F (36.8 C)     Temp Source 12/08/22 0844 Oral     SpO2 12/08/22 0844 97 %     Weight 12/08/22 0842 170 lb (77.1 kg)     Height --      Head Circumference --      Peak Flow --      Pain Score 12/08/22 0842 5     Pain Loc --      Pain Education --      Exclude from Growth Chart --    No data found.  Updated Vital Signs BP 125/88 (BP Location: Left Arm)   Pulse 94   Temp 98.2 F (36.8 C) (Oral)   Resp 17   Wt 170 lb (77.1 kg)   SpO2 97%   BMI 27.44 kg/m  Visual Acuity Right Eye Distance:   Left Eye Distance:   Bilateral Distance:    Right Eye Near:   Left Eye Near:    Bilateral Near:     Physical Exam Constitutional:      General: She is not in acute distress.    Appearance: Normal appearance. She is not toxic-appearing or diaphoretic.  HENT:     Head: Normocephalic and atraumatic.  Eyes:     Extraocular Movements: Extraocular movements intact.     Conjunctiva/sclera: Conjunctivae normal.     Pupils: Pupils are equal, round, and reactive to light.  Pulmonary:     Effort: Pulmonary effort is normal.  Neurological:     General: No focal deficit present.     Mental Status: She is alert and oriented to person, place, and time. Mental status is at baseline.     Cranial Nerves: Cranial nerves 2-12 are intact.     Sensory: Sensation is intact.     Motor: Motor function is intact.     Coordination: Coordination is intact.     Gait: Gait is intact.  Psychiatric:        Mood and Affect: Mood normal.        Behavior: Behavior normal.        Thought Content: Thought content normal.        Judgment: Judgment normal.      UC Treatments / Results  Labs (all labs ordered are  listed, but only abnormal results are displayed) Labs Reviewed - No data to display  EKG   Radiology No results found.  Procedures Procedures (including critical care time)  Medications Ordered in UC Medications  ketorolac (TORADOL) 30 MG/ML injection 30 mg (30 mg Intramuscular Given 12/08/22 0903)  dexamethasone (DECADRON) injection 10 mg (10 mg Intramuscular Given 12/08/22 0904)  ondansetron (ZOFRAN-ODT) disintegrating tablet 4 mg (4 mg Oral Given 12/08/22 0903)    Initial Impression / Assessment and Plan / UC Course  I have reviewed the triage vital signs and the nursing notes.  Pertinent labs & imaging results that were available during my care of the patient were reviewed by me and considered in my medical decision making (see chart for details).     Suspect possible migraine headache.  Given no recent falls, head injuries, duration of symptoms, and neuroexam being normal, do not think emergent evaluation or CT imaging of the head is necessary.  Will give migraine cocktail today.  Although, discussed with patient need for emergent evaluation if headache persists or worsens in the next 24 to 48 hours.  Patient verbalized understanding and was agreeable with plan. Final Clinical Impressions(s) / UC Diagnoses   Final diagnoses:  Other migraine without status migrainosus, not intractable     Discharge Instructions      You were given several medications for migraine cocktail here in urgent care today.  Do not take any ibuprofen, Advil, Aleve, aspirin, Excedrin for at least 24 hours.  If headache does not improve or if it worsens in the next 24 to 48 hours, please go straight to the emergency department.    ED Prescriptions   None    PDMP not reviewed this encounter.   Gustavus Bryant, [place] 12/08/22 [phone]

## 2022-12-08 NOTE — ED Triage Notes (Signed)
Migraine that started Monday. Took Excedrin migraine with no relief.

## 2022-12-08 NOTE — Discharge Instructions (Signed)
You were given several medications for migraine cocktail here in urgent care today.  Do not take any ibuprofen, Advil, Aleve, aspirin, Excedrin for at least 24 hours.  If headache does not improve or if it worsens in the next 24 to 48 hours, please go straight to the emergency department.

## 2023-03-08 ENCOUNTER — Other Ambulatory Visit: Payer: Self-pay

## 2023-03-08 NOTE — Telephone Encounter (Signed)
Requesting: Sertraline HCL 100mg  Last Visit: 07/01/2021 Next Visit: Visit date not found Last Refill: 09/21/2021  Please Advise

## 2023-03-12 ENCOUNTER — Other Ambulatory Visit: Payer: Self-pay | Admitting: Nurse Practitioner

## 2023-03-12 ENCOUNTER — Telehealth: Payer: Self-pay

## 2023-03-12 NOTE — Telephone Encounter (Signed)
Requesting: Semgrtraline 100 Last Visit: 07/01/2021 Next Visit: Visit date not found Last Refill: 09/21/2021  Please Advise

## 2023-03-13 NOTE — Telephone Encounter (Signed)
Requesting: SERTRALINE 100 MG TAB[*]  Last Visit: 07/01/2021 Next Visit: Visit date not found Last Refill: 09/21/2021  Please Advise    Patient needs an appointment
